# Patient Record
Sex: Female | Born: 1995 | Race: White | Hispanic: No | Marital: Single
Health system: Southern US, Community
[De-identification: ages and names within clinical notes are randomized; demographics above are authoritative.]

## PROBLEM LIST (undated history)

## (undated) ENCOUNTER — Inpatient Hospital Stay (HOSPITAL_COMMUNITY): Payer: Self-pay

---

## 2017-12-25 ENCOUNTER — Encounter (HOSPITAL_COMMUNITY): Payer: Self-pay

## 2017-12-25 ENCOUNTER — Inpatient Hospital Stay (HOSPITAL_COMMUNITY): Payer: 59

## 2017-12-25 ENCOUNTER — Inpatient Hospital Stay (HOSPITAL_COMMUNITY)
Admission: AD | Admit: 2017-12-25 | Discharge: 2017-12-25 | Disposition: A | Discharge status: Home / Self Care | Payer: 59 | Source: Ambulatory Visit | Attending: Obstetrics and Gynecology | Admitting: Obstetrics and Gynecology

## 2017-12-25 DIAGNOSIS — O209 Hemorrhage in early pregnancy, unspecified: Secondary | ICD-10-CM | POA: Diagnosis not present

## 2017-12-25 DIAGNOSIS — Z3A01 Less than 8 weeks gestation of pregnancy: Secondary | ICD-10-CM | POA: Diagnosis not present

## 2017-12-25 DIAGNOSIS — N898 Other specified noninflammatory disorders of vagina: Secondary | ICD-10-CM | POA: Diagnosis present

## 2017-12-25 DIAGNOSIS — O26851 Spotting complicating pregnancy, first trimester: Secondary | ICD-10-CM

## 2017-12-25 DIAGNOSIS — Z3491 Encounter for supervision of normal pregnancy, unspecified, first trimester: Secondary | ICD-10-CM

## 2017-12-25 LAB — URINALYSIS, ROUTINE W REFLEX MICROSCOPIC
Bilirubin Urine: NEGATIVE
GLUCOSE, UA: NEGATIVE mg/dL
Hgb urine dipstick: NEGATIVE
Ketones, ur: 20 mg/dL — AB
Leukocytes, UA: NEGATIVE
Nitrite: NEGATIVE
PH: 5 (ref 5.0–8.0)
Protein, ur: NEGATIVE mg/dL
Specific Gravity, Urine: 1.01 (ref 1.005–1.030)

## 2017-12-25 LAB — CBC
HCT: 41.4 % (ref 36.0–46.0)
Hemoglobin: 14.8 g/dL (ref 12.0–15.0)
MCH: 30.7 pg (ref 26.0–34.0)
MCHC: 35.7 g/dL (ref 30.0–36.0)
MCV: 85.9 fL (ref 78.0–100.0)
Platelets: 321 10*3/uL (ref 150–400)
RBC: 4.82 MIL/uL (ref 3.87–5.11)
RDW: 12.5 % (ref 11.5–15.5)
WBC: 10.7 10*3/uL — ABNORMAL HIGH (ref 4.0–10.5)

## 2017-12-25 LAB — POCT PREGNANCY, URINE: PREG TEST UR: POSITIVE — AB

## 2017-12-25 LAB — WET PREP, GENITAL
Clue Cells Wet Prep HPF POC: NONE SEEN
Sperm: NONE SEEN
Trich, Wet Prep: NONE SEEN
Yeast Wet Prep HPF POC: NONE SEEN

## 2017-12-25 LAB — HCG, QUANTITATIVE, PREGNANCY: hCG, Beta Chain, Quant, S: 11884 m[IU]/mL — ABNORMAL HIGH (ref <5)

## 2017-12-25 NOTE — Discharge Instructions (Signed)

## 2017-12-25 NOTE — MAU Provider Note (Signed)
History     CSN: [id number]  Arrival date and time: 12/25/17 9562   First Provider Initiated Contact with Patient 12/25/17 2109      Chief Complaint  Patient presents with  . Vaginal Discharge   Sierra Lopez is a 22 y.o. G1P0 who presents to MAU with complaints of vaginal discharge. She reports a pinkish brown discharge that started 2-3 days ago. She reports vaginal discharge is consistent with and occurs when she wipes in the bathroom. She reports recent IC that occurred this past Saturday and she started noticing the vaginal discharge worsened after intercourse. She denies abdominal pain currently- reports abdominal pain that occurred 2 weeks ago when she found out she was pregnant, she reports abdominal cramping that occurred for 1 week, denies pain now. She is planning on receiving prenatal care in [place] and has appointment scheduled for 9/30.     OB History    Gravida  1   Para      Term      Preterm      AB      Living        SAB      TAB      Ectopic      Multiple      Live Births              No past medical history on file.  No family history on file.  Social History   Tobacco Use  . Smoking status: Not on file  Substance Use Topics  . Alcohol use: Not on file  . Drug use: Not on file    Allergies: No Known Allergies  No medications prior to admission.    Review of Systems  Constitutional: Negative.   Respiratory: Negative.   Cardiovascular: Negative.   Gastrointestinal: Negative.   Genitourinary: Positive for vaginal discharge. Negative for difficulty urinating, dysuria, frequency, hematuria, urgency and vaginal bleeding.  Musculoskeletal: Negative.    Physical Exam   Blood pressure (!) 146/81, pulse 69, temperature 98.2 F (36.8 C), temperature source Oral, resp. rate 18, height 5\' 6"  (1.676 m), weight 63.5 kg, last menstrual period 11/14/2017, SpO2 100 %.  Physical Exam  Nursing note and vitals reviewed. Constitutional: She  is oriented to person, place, and time. She appears well-developed and well-nourished. No distress.  HENT:  Head: Normocephalic.  Cardiovascular: Normal rate, regular rhythm and normal heart sounds.  Respiratory: Effort normal and breath sounds normal. No respiratory distress. She has no wheezes.  GI: Soft. Bowel sounds are normal. She exhibits no distension. There is no tenderness. There is no guarding.  Genitourinary: Vaginal discharge found.  Neurological: She is alert and oriented to person, place, and time.  Skin: Skin is warm and dry.  Psychiatric: She has a normal mood and affect. Her behavior is normal. Thought content normal.    MAU Course  Procedures  MDM CBC HCG Wet prep GC/C [place]  ABO/Rh  Results for orders placed or performed during the hospital encounter of 12/25/17 (from the past 24 hour(s))  Urinalysis, Routine w reflex microscopic     Status: Abnormal   Collection Time: 12/25/17  8:24 PM  Result Value Ref Range   Color, Urine YELLOW YELLOW   APPearance HAZY (A) CLEAR   Specific Gravity, Urine 1.010 1.005 - 1.030   pH 5.0 5.0 - 8.0   Glucose, UA NEGATIVE NEGATIVE mg/dL   Hgb urine dipstick NEGATIVE NEGATIVE   Bilirubin Urine NEGATIVE NEGATIVE   Ketones, ur  20 (A) NEGATIVE mg/dL   Protein, ur NEGATIVE NEGATIVE mg/dL   Nitrite NEGATIVE NEGATIVE   Leukocytes, UA NEGATIVE NEGATIVE  Wet prep, genital     Status: Abnormal   Collection Time: 12/25/17  8:24 PM  Result Value Ref Range   Yeast Wet Prep HPF POC NONE SEEN NONE SEEN   Trich, Wet Prep NONE SEEN NONE SEEN   Clue Cells Wet Prep HPF POC NONE SEEN NONE SEEN   WBC, Wet Prep HPF POC FEW (A) NONE SEEN   Sperm NONE SEEN   Pregnancy, urine POC     Status: Abnormal   Collection Time: 12/25/17  8:28 PM  Result Value Ref Range   Preg Test, Ur POSITIVE (A) NEGATIVE  CBC     Status: Abnormal   Collection Time: 12/25/17  9:29 PM  Result Value Ref Range   WBC 10.7 (H) 4.0 - 10.5 K/uL   RBC 4.82 3.87 - 5.11 MIL/uL    Hemoglobin 14.8 12.0 - 15.0 g/dL   HCT 16.1 09.6 - 04.5 %   MCV 85.9 78.0 - 100.0 fL   MCH 30.7 26.0 - 34.0 pg   MCHC 35.7 30.0 - 36.0 g/dL   RDW 40.9 81.1 - 91.4 %   Platelets 321 150 - 400 K/uL  ABO/Rh     Status: None (Preliminary result)   Collection Time: 12/25/17  9:29 PM  Result Value Ref Range   ABO/RH(D)      A POS Performed at [facility], [address], [place], [place] [address]   hCG, quantitative, pregnancy     Status: Abnormal   Collection Time: 12/25/17  9:29 PM  Result Value Ref Range   hCG, Beta Chain, Quant, S 11,884 (H) <5 mIU/mL    US Ob Less Than 14 Weeks With Ob Transvaginal  Result Date: 12/25/2017 CLINICAL DATA:  Vaginal spotting. Positive pregnancy test. Gestational age by last menstrual period 5 weeks and 6 days. EXAM: OBSTETRIC <14 WK [place] AND TRANSVAGINAL OB US TECHNIQUE: Both transabdominal and transvaginal ultrasound examinations were performed for complete evaluation of the gestation as well as the maternal uterus, adnexal regions, and pelvic cul-de-sac. Transvaginal technique was performed to assess early pregnancy. COMPARISON:  None. FINDINGS: Intrauterine gestational sac: Present. Yolk sac:  Present. Embryo:  Not present. Cardiac Activity: Not present. MSD: 7.9 mm   5 w   3 d Subchorionic hemorrhage:  None visualized. Maternal uterus/adnexae: Normal appearance of the adnexa. No free fluid. IMPRESSION: Probable early intrauterine gestational and yolk sac, without fetal pole, or cardiac activity yet visualized. Recommend follow-up quantitative B-HCG levels and follow-up US in 14 days to confirm and assess viability. This recommendation follows SRU consensus guidelines: Diagnostic Criteria for Nonviable Pregnancy Early in the First Trimester. Malva Limes Med 2013; 621:3086-57. Electronically Signed   By: Awilda Metro M.D.   On: 12/25/2017 21:50   Wet prep- negative GC/C pending  CBC- WNL  [place] and labs reviewed with patient. Discussed with patient  ectopic pregnancy is ruled out with IUGS and Yolk sac being seen on [place]. Educated and discussed with patient to start prenatal care as scheduled- start taking prenatal vitamins. Discussed reasons to return to MAU with increased vaginal bleeding like a period and/or abdominal pain. Patient verbalizes understanding.   Assessment and Plan   1. Normal IUP (intrauterine pregnancy) on prenatal ultrasound, first trimester   2. Spotting during pregnancy in first trimester   3. [redacted] weeks gestation of pregnancy    Discharge home. Pt  stable prior to discharge home  Return to MAU as needed for emergencies  Emphasized need to be seen at hospital she plans on delivering at for prenatal emergencies, [facility]  Start taking prenatal vitamins     Sharyon CableVeronica C Laurieann Friddle CNM 12/25/2017, 10:13 PM

## 2017-12-25 NOTE — MAU Note (Signed)
Pt here with c/o positive HPT and brownish discharge the last couple of days. Denies any pain. Denies bleeding.

## 2017-12-26 LAB — GC/CHLAMYDIA PROBE AMP (~~LOC~~) NOT AT ARMC
Chlamydia: NEGATIVE
Neisseria Gonorrhea: NEGATIVE

## 2017-12-26 LAB — ABO/RH: ABO/RH(D): A POS

## 2018-11-13 ENCOUNTER — Encounter (HOSPITAL_COMMUNITY): Payer: Self-pay

## 2020-08-06 IMAGING — US US OB < 14 WEEKS - US OB TV
1 series · 15 of 28 positions shown · non-contrast
Comparison: None.

CLINICAL DATA: Vaginal spotting. Positive pregnancy test.
Gestational age by last menstrual period 5 weeks and 6 days.

EXAM:
OBSTETRIC <14 WK US AND TRANSVAGINAL OB US
TECHNIQUE: Both transabdominal and transvaginal ultrasound examinations were
performed for complete evaluation of the gestation as well as the
maternal uterus, adnexal regions, and pelvic cul-de-sac.
Transvaginal technique was performed to assess early pregnancy.

[Series 1: us ob < 14 weeks - us ob tv · 15 of 57 slices shown]
[im 1/57]
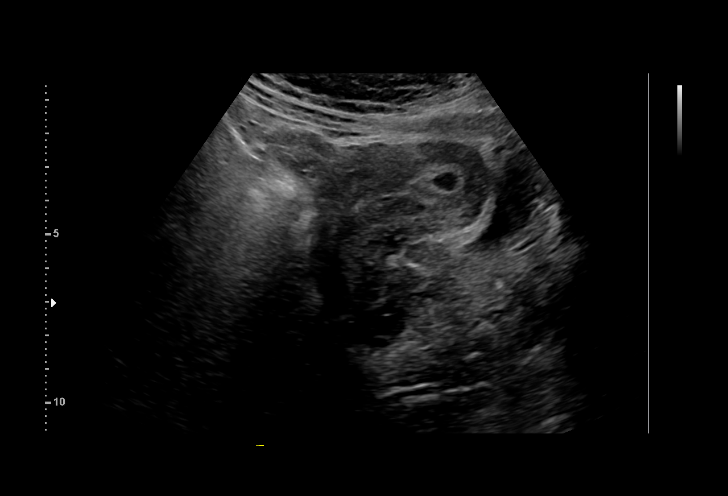
[im 5/57]
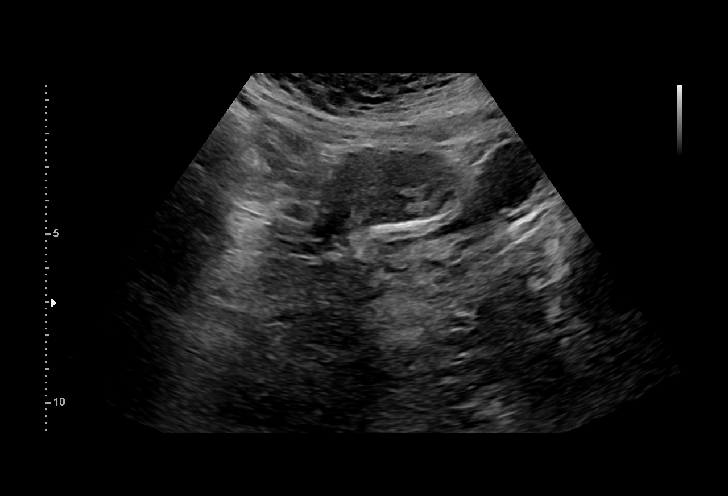
[im 9/57]
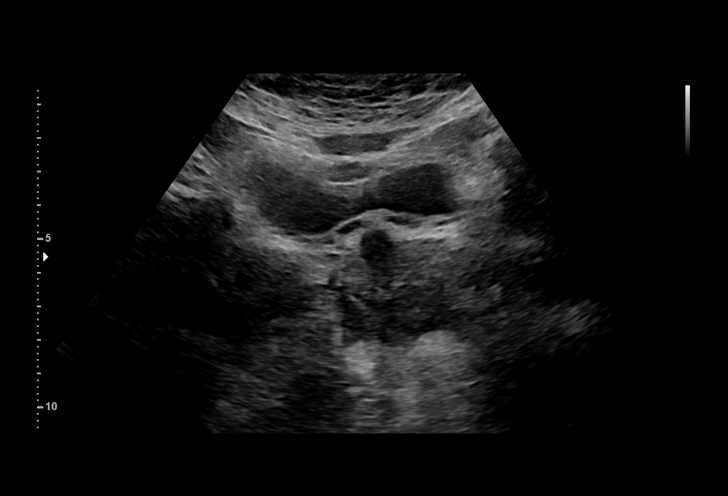
[im 13/57]
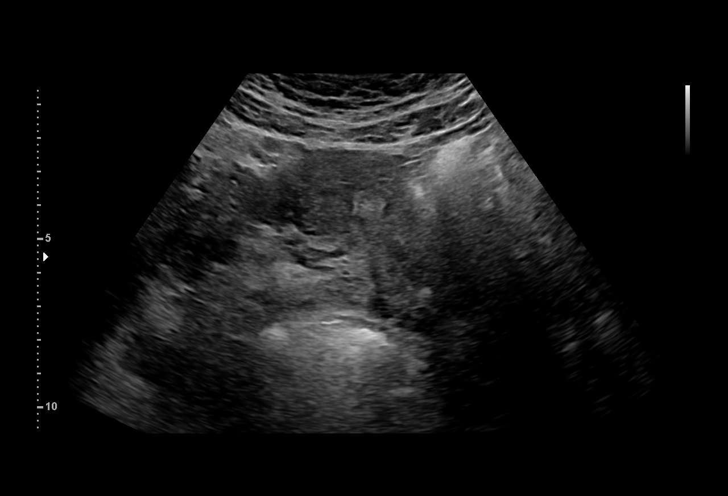
[im 17/57]
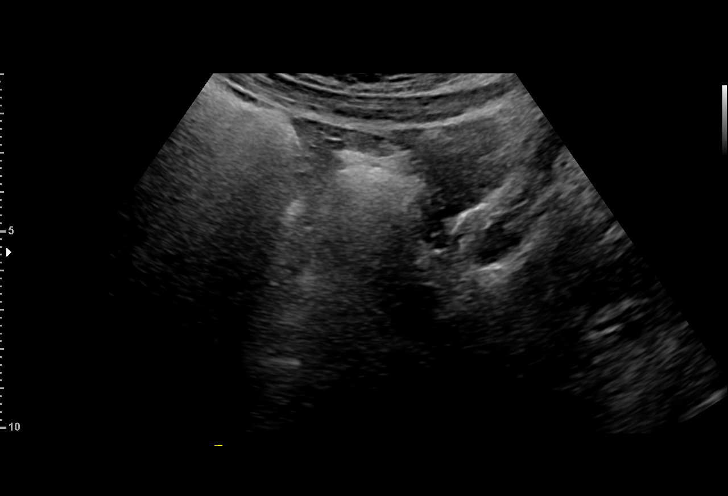
[im 21/57]
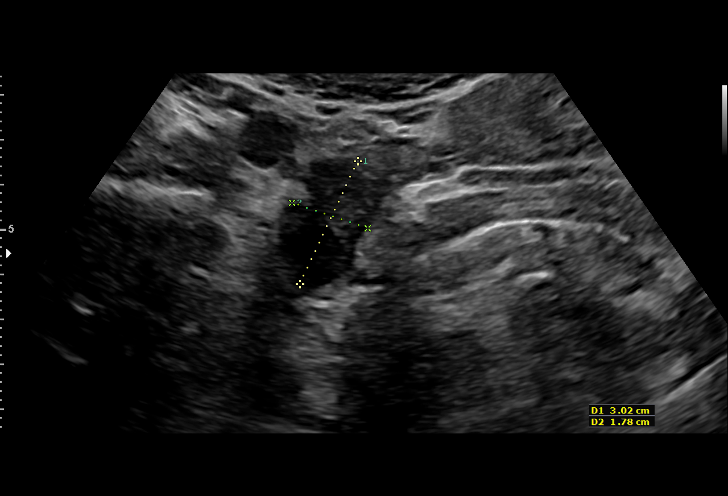
[im 25/57]
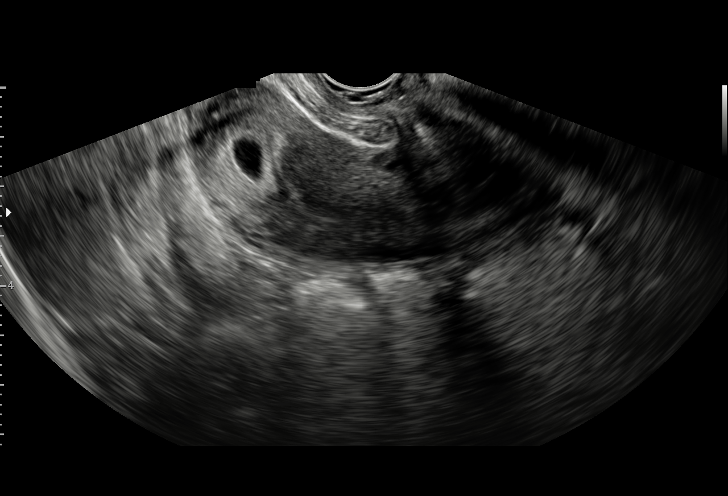
[im 30/57]
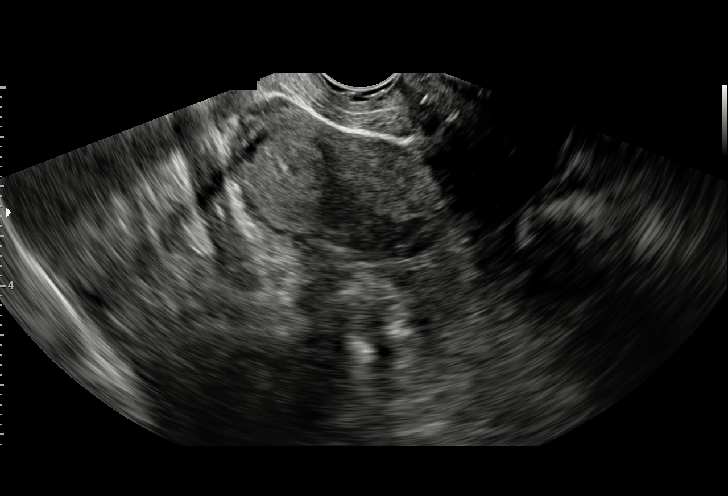
[im 32/57]
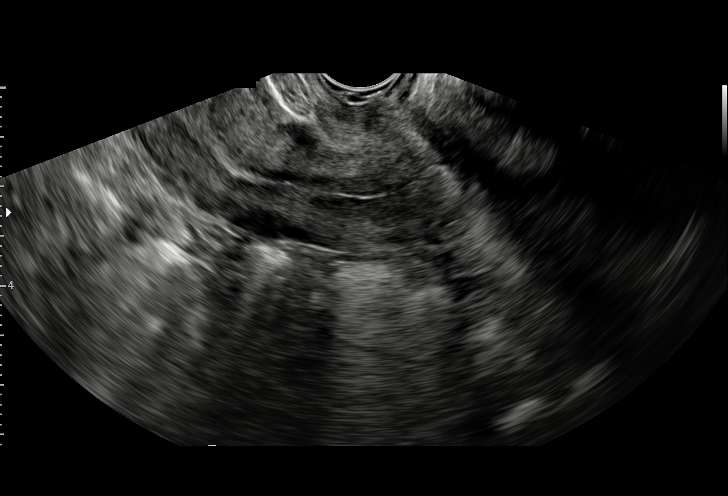
[im 36/57]
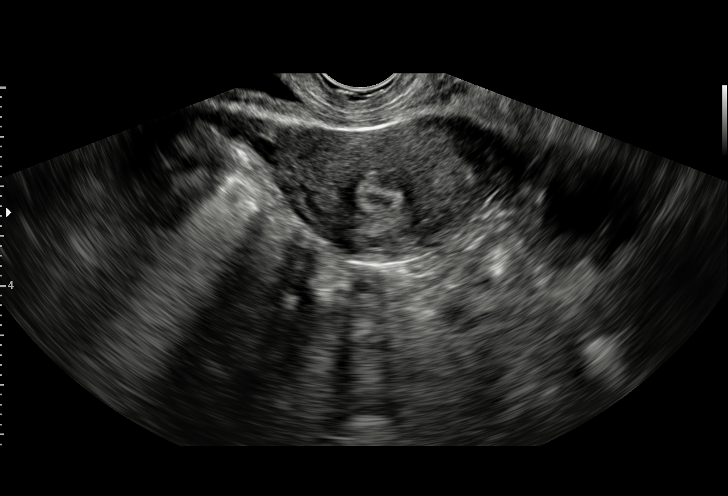
[im 40/57]
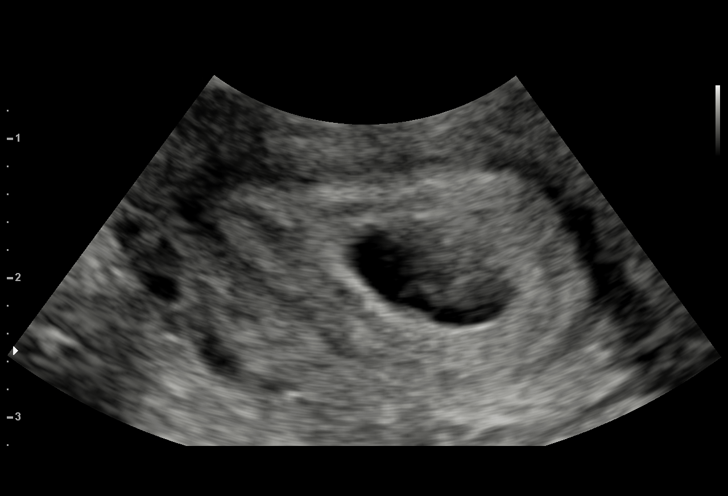
[im 44/57]
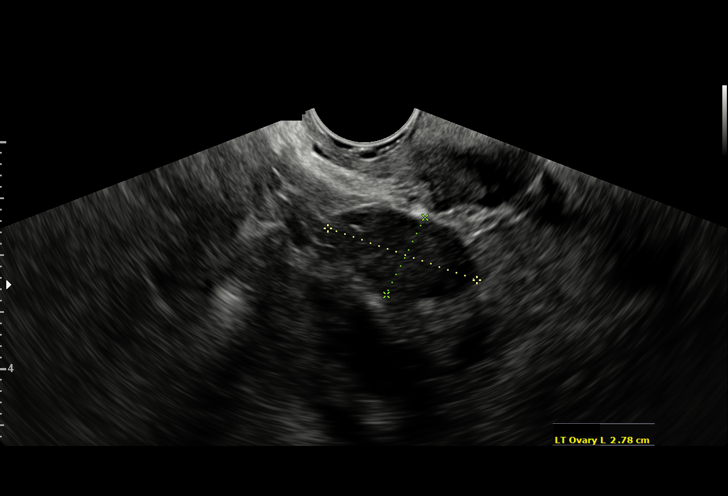
[im 48/57]
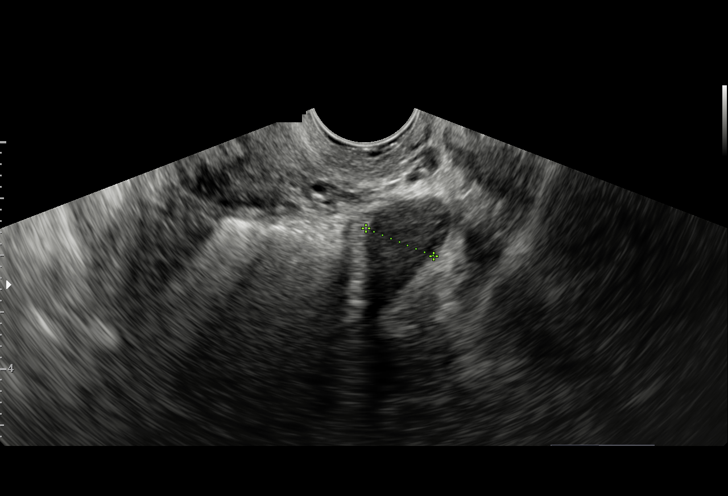
[im 52/57]
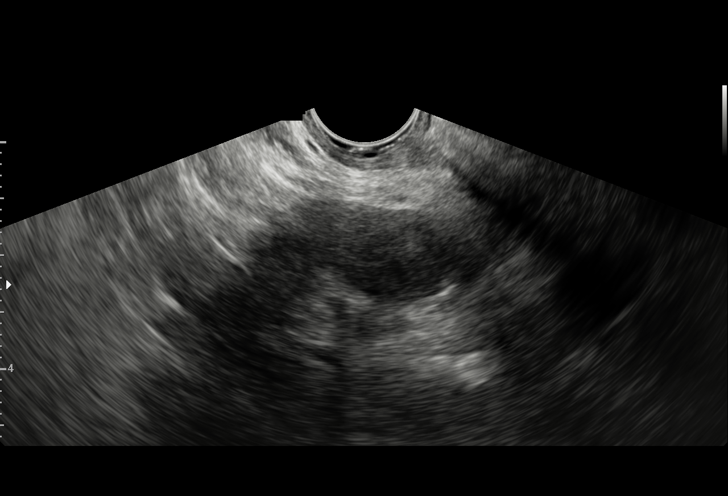
[im 57/57]
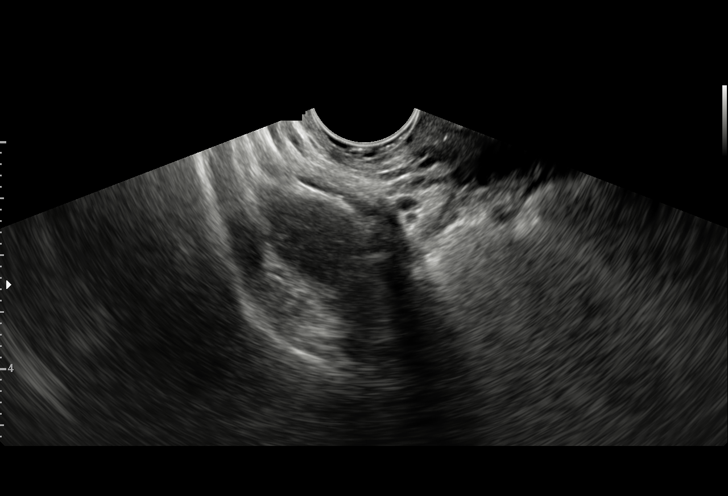

[15 of 28 positions shown; findings below may reference images not displayed]

FINDINGS: Intrauterine gestational sac: Present.

Yolk sac:  Present.

Embryo:  Not present.

Cardiac Activity: Not present.

MSD: 7.9 mm   5 w   3 d

Subchorionic hemorrhage:  None visualized.

Maternal uterus/adnexae: Normal appearance of the adnexa. No free
fluid.
IMPRESSION: Probable early intrauterine gestational and yolk sac, without fetal
pole, or cardiac activity yet visualized. Recommend follow-up
quantitative B-HCG levels and follow-up US in 14 days to confirm and
assess viability. This recommendation follows SRU consensus
guidelines: Diagnostic Criteria for Nonviable Pregnancy Early in the
First Trimester. N Engl J Med 8538; [DATE].
